# Patient Record
Sex: Female | Born: 1955 | Race: White | Hispanic: No | Smoking: Current every day smoker
Health system: Southern US, Community
[De-identification: ages and names within clinical notes are randomized; demographics above are authoritative.]

## PROBLEM LIST (undated history)

## (undated) DIAGNOSIS — R569 Unspecified convulsions: Secondary | ICD-10-CM

## (undated) DIAGNOSIS — F419 Anxiety disorder, unspecified: Secondary | ICD-10-CM

## (undated) HISTORY — PX: ABDOMINAL HYSTERECTOMY: SHX81

---

## 2004-07-25 ENCOUNTER — Emergency Department: Payer: Self-pay | Admitting: Unknown Physician Specialty

## 2004-08-11 ENCOUNTER — Emergency Department: Payer: Self-pay | Admitting: Emergency Medicine

## 2005-01-07 ENCOUNTER — Emergency Department: Payer: Self-pay | Admitting: Emergency Medicine

## 2005-01-08 ENCOUNTER — Ambulatory Visit: Payer: Self-pay | Admitting: Emergency Medicine

## 2005-07-29 ENCOUNTER — Emergency Department: Payer: Self-pay | Admitting: Internal Medicine

## 2008-02-05 ENCOUNTER — Emergency Department: Payer: Self-pay | Admitting: Emergency Medicine

## 2013-05-02 ENCOUNTER — Emergency Department: Payer: Self-pay | Admitting: Emergency Medicine

## 2013-07-22 ENCOUNTER — Emergency Department: Payer: Self-pay | Admitting: Emergency Medicine

## 2013-07-22 LAB — BASIC METABOLIC PANEL
Anion Gap: 11 (ref 7–16)
BUN: 19 mg/dL — AB (ref 7–18)
CALCIUM: 8.9 mg/dL (ref 8.5–10.1)
CREATININE: 1.11 mg/dL (ref 0.60–1.30)
Chloride: 107 mmol/L (ref 98–107)
Co2: 23 mmol/L (ref 21–32)
EGFR (African American): >60
EGFR (Non-African Amer.): 55 — ABNORMAL LOW
Glucose: 102 mg/dL — ABNORMAL HIGH (ref 65–99)
Osmolality: 284 (ref 275–301)
Potassium: 3.7 mmol/L (ref 3.5–5.1)
Sodium: 141 mmol/L (ref 136–145)

## 2013-07-22 LAB — CBC WITH DIFFERENTIAL/PLATELET
Basophil #: 0 10*3/uL (ref 0.0–0.1)
Basophil %: 0.1 %
EOS ABS: 0.4 10*3/uL (ref 0.0–0.7)
EOS PCT: 2.8 %
HCT: 39.8 % (ref 35.0–47.0)
HGB: 13.4 g/dL (ref 12.0–16.0)
Lymphocyte #: 5.5 10*3/uL — ABNORMAL HIGH (ref 1.0–3.6)
Lymphocyte %: 36.7 %
MCH: 30.1 pg (ref 26.0–34.0)
MCHC: 33.6 g/dL (ref 32.0–36.0)
MCV: 90 fL (ref 80–100)
Monocyte #: 0.9 x10 3/mm (ref 0.2–0.9)
Monocyte %: 6.2 %
NEUTROS ABS: 8.1 10*3/uL — AB (ref 1.4–6.5)
Neutrophil %: 54.2 %
Platelet: 213 10*3/uL (ref 150–440)
RBC: 4.44 10*6/uL (ref 3.80–5.20)
RDW: 14.4 % (ref 11.5–14.5)
WBC: 14.9 10*3/uL — AB (ref 3.6–11.0)

## 2013-07-22 LAB — URIC ACID: URIC ACID: 6.8 mg/dL — AB (ref 2.6–6.0)

## 2013-09-16 ENCOUNTER — Emergency Department: Payer: Self-pay | Admitting: Emergency Medicine

## 2013-09-16 LAB — URINALYSIS, COMPLETE
BILIRUBIN, UR: NEGATIVE
BLOOD: NEGATIVE
Bacteria: NONE SEEN
Glucose,UR: NEGATIVE mg/dL (ref 0–75)
KETONE: NEGATIVE
LEUKOCYTE ESTERASE: NEGATIVE
Nitrite: NEGATIVE
PH: 6 (ref 4.5–8.0)
Protein: NEGATIVE
SPECIFIC GRAVITY: 1.018 (ref 1.003–1.030)
Squamous Epithelial: <1
WBC UR: 5 /HPF (ref 0–5)

## 2013-09-16 LAB — CBC WITH DIFFERENTIAL/PLATELET
BASOS ABS: 0.2 10*3/uL — AB (ref 0.0–0.1)
Basophil %: 1.3 %
EOS ABS: 0.3 10*3/uL (ref 0.0–0.7)
Eosinophil %: 2.2 %
HCT: 39.7 % (ref 35.0–47.0)
HGB: 13.4 g/dL (ref 12.0–16.0)
LYMPHS PCT: 26.9 %
Lymphocyte #: 4.3 10*3/uL — ABNORMAL HIGH (ref 1.0–3.6)
MCH: 29.8 pg (ref 26.0–34.0)
MCHC: 33.9 g/dL (ref 32.0–36.0)
MCV: 88 fL (ref 80–100)
MONO ABS: 1 x10 3/mm — AB (ref 0.2–0.9)
Monocyte %: 6.3 %
NEUTROS PCT: 63.3 %
Neutrophil #: 10.1 10*3/uL — ABNORMAL HIGH (ref 1.4–6.5)
PLATELETS: 204 10*3/uL (ref 150–440)
RBC: 4.51 10*6/uL (ref 3.80–5.20)
RDW: 14.5 % (ref 11.5–14.5)
WBC: 15.9 10*3/uL — AB (ref 3.6–11.0)

## 2013-09-16 LAB — COMPREHENSIVE METABOLIC PANEL
ALK PHOS: 114 U/L
ALT: 26 U/L
Albumin: 3.3 g/dL — ABNORMAL LOW (ref 3.4–5.0)
Anion Gap: 9 (ref 7–16)
BUN: 18 mg/dL (ref 7–18)
CO2: 22 mmol/L (ref 21–32)
CREATININE: 1.08 mg/dL (ref 0.60–1.30)
Calcium, Total: 8.7 mg/dL (ref 8.5–10.1)
Chloride: 110 mmol/L — ABNORMAL HIGH (ref 98–107)
EGFR (African American): >60
EGFR (Non-African Amer.): 57 — ABNORMAL LOW
Glucose: 110 mg/dL — ABNORMAL HIGH (ref 65–99)
Osmolality: 284 (ref 275–301)
Potassium: 3.8 mmol/L (ref 3.5–5.1)
SGOT(AST): 22 U/L (ref 15–37)
Sodium: 141 mmol/L (ref 136–145)
Total Protein: 7.5 g/dL (ref 6.4–8.2)

## 2014-05-29 ENCOUNTER — Emergency Department
Admission: EM | Admit: 2014-05-29 | Discharge: 2014-05-30 | Disposition: A | Discharge status: Home / Self Care | Payer: Medicaid Other | Attending: Student | Admitting: Student

## 2014-05-29 ENCOUNTER — Encounter: Payer: Self-pay | Admitting: *Deleted

## 2014-05-29 DIAGNOSIS — R519 Headache, unspecified: Secondary | ICD-10-CM

## 2014-05-29 DIAGNOSIS — R51 Headache: Secondary | ICD-10-CM | POA: Diagnosis not present

## 2014-05-29 DIAGNOSIS — Z79899 Other long term (current) drug therapy: Secondary | ICD-10-CM | POA: Insufficient documentation

## 2014-05-29 HISTORY — DX: Anxiety disorder, unspecified: F41.9

## 2014-05-29 HISTORY — DX: Unspecified convulsions: R56.9

## 2014-05-29 NOTE — ED Notes (Signed)
PT ambulatory to triage with a headache since this morning.  Taking otc meds without relief.  Pt has nausea.  Alert.  Speech clear.

## 2014-05-29 NOTE — ED Provider Notes (Signed)
[facility] Emergency Department Provider Note  ____________________________________________  Time seen: Approximately 11:51 PM  I have reviewed the triage vital signs and the nursing notes.   HISTORY  Chief Complaint Headache    HPI Colleen HiddenRicky A Sanders is a 59 y.o. female with history of seizures, anxiety, headaches, presents for evaluation of gradual onset frontal throbbing headache that began this morning. Headache was not maximal at onset. She has felt nauseated but has had no vomiting. She has had this type of headache before and believes it may be stress-related. She has had no fevers, no neck pain or neck stiffness. Current severity 7 out of 10, pain has been constant. No modifying factors.   Past Medical History  Diagnosis Date  . Seizures   . Anxiety     There are no active problems to display for this patient.   History reviewed. No pertinent past surgical history.  Current Outpatient Rx  Name  Route  Sig  Dispense  Refill  . phenytoin (DILANTIN) 200 MG ER capsule   Oral   Take 200 mg by mouth 2 (two) times daily.           Allergies Review of patient's allergies indicates no known allergies.  No family history on file.  Social History History  Substance Use Topics  . Smoking status: Never Smoker   . Smokeless tobacco: Not on file  . Alcohol Use: No    Review of Systems Constitutional: No fever/chills Eyes: No visual changes. ENT: No sore throat. Cardiovascular: Denies chest pain. Respiratory: Denies shortness of breath. Gastrointestinal: No abdominal pain.  + nausea, no vomiting.  No diarrhea.  No constipation. Genitourinary: Negative for dysuria. Musculoskeletal: Negative for back pain. Skin: Negative for rash. Neurological: Positive for headaches, no focal weakness or numbness.  10-point ROS otherwise negative.  ____________________________________________   PHYSICAL EXAM:  VITAL SIGNS: ED Triage Vitals  Enc  Vitals Group     BP 05/29/14 2140 118/62 mmHg     Pulse Rate 05/29/14 2140 79     Resp 05/29/14 2140 20     Temp 05/29/14 2140 97.5 F (36.4 C)     Temp Source 05/29/14 2140 Oral     SpO2 05/29/14 2140 95 %     Weight 05/29/14 2140 200 lb (90.719 kg)     Height 05/29/14 2140 5\' 6"  (1.676 m)     Head Cir --      Peak Flow --      Pain Score 05/29/14 2142 9     Pain Loc --      Pain Edu? --      Excl. in GC? --     Constitutional: Alert and oriented. Well appearing and in no acute distress. Comfortably sitting at the edge of the bed Eyes: Conjunctivae are normal. PERRL. EOMI. Head: Atraumatic. Nose: No congestion/rhinnorhea. Mouth/Throat: Mucous membranes are moist.  Oropharynx non-erythematous. Neck: No stridor.  Supple without meningismus Cardiovascular: Normal rate, regular rhythm. Grossly normal heart sounds.  Good peripheral circulation. Respiratory: Normal respiratory effort.  No retractions. Lungs CTAB. Gastrointestinal: Soft and nontender. No distention. No abdominal bruits. No CVA tenderness. Genitourinary: Deferred Musculoskeletal: No lower extremity tenderness nor edema.  No joint effusions. Neurologic:  Normal speech and language. No gross focal neurologic deficits are appreciated. Speech is normal. No gait instability. 5 out of 5 strength in bilateral upper and lower extremities, sensation intact to light touch throughout, cranial nerves II through XII intact Skin:  Skin is warm, dry  and intact. No rash noted. Psychiatric: Mood and affect are normal. Speech and behavior are normal.  ____________________________________________   LABS (all labs ordered are listed, but only abnormal results are displayed)  Labs Reviewed - No data to display ____________________________________________  EKG none ____________________________________________  RADIOLOGY  none ____________________________________________   PROCEDURES  Procedure(s) performed: None  Critical  Care performed: No  ____________________________________________   INITIAL IMPRESSION / ASSESSMENT AND PLAN / ED COURSE  Pertinent labs & imaging results that were available during my care of the patient were reviewed by me and considered in my medical decision making (see chart for details).  Colleen HiddenRicky A Colleen Sanders is a 59 y.o. female with history of seizures, anxiety, headaches, presents for evaluation of gradual onset frontal throbbing headache that began this morning. On exam she is very well-appearing and in no acute distress. Supple neck, afebrile, intact neurological exam. Suspect tension versus migraine headache. We'll treat with migraine cocktail and anticipate discharge home. Not consistent with subarachnoid hemorrhage or meningitis.  ----------------------------------------- 2:02 AM on 05/30/2014 -----------------------------------------  The patient is resting comfortably. She continues to appear well. She reports headache is completely resolved at this time (currently 0 out of 10 on the pain scale) with Reglan, Toradol, Benadryl. DC home with return precautions. ____________________________________________   FINAL CLINICAL IMPRESSION(S) / ED DIAGNOSES  Final diagnoses:  Frontal headache      Colleen DossEryka A Mckala Pantaleon, MD 05/30/14 0202

## 2014-05-30 ENCOUNTER — Encounter: Payer: Self-pay | Admitting: Emergency Medicine

## 2014-05-30 MED ORDER — DIPHENHYDRAMINE HCL 50 MG/ML IJ SOLN
INTRAMUSCULAR | Status: AC
  Administered 2014-05-30: 12.5 mg via INTRAVENOUS
  Filled 2014-05-30: qty 1

## 2014-05-30 MED ORDER — KETOROLAC TROMETHAMINE 30 MG/ML IJ SOLN
INTRAMUSCULAR | Status: AC
  Administered 2014-05-30: 30 mg via INTRAVENOUS
  Filled 2014-05-30: qty 1

## 2014-05-30 MED ORDER — METOCLOPRAMIDE HCL 5 MG/ML IJ SOLN
INTRAMUSCULAR | Status: AC
  Administered 2014-05-30: 10 mg via INTRAVENOUS
  Filled 2014-05-30: qty 2

## 2014-05-30 MED ORDER — DIPHENHYDRAMINE HCL 50 MG/ML IJ SOLN
12.5000 mg | Freq: Once | INTRAMUSCULAR | Status: AC
  Administered 2014-05-30: 12.5 mg via INTRAVENOUS

## 2014-05-30 MED ORDER — METOCLOPRAMIDE HCL 5 MG/ML IJ SOLN
10.0000 mg | Freq: Once | INTRAMUSCULAR | Status: AC
  Administered 2014-05-30: 10 mg via INTRAVENOUS

## 2014-05-30 MED ORDER — SODIUM CHLORIDE 0.9 % IV BOLUS (SEPSIS)
500.0000 mL | Freq: Once | INTRAVENOUS | Status: AC
  Administered 2014-05-30: 500 mL via INTRAVENOUS

## 2014-05-30 MED ORDER — KETOROLAC TROMETHAMINE 30 MG/ML IJ SOLN
30.0000 mg | Freq: Once | INTRAMUSCULAR | Status: AC
  Administered 2014-05-30: 30 mg via INTRAVENOUS

## 2014-05-30 NOTE — ED Notes (Signed)
Patient reports she has had a headache since this morning and that she has tried taking the usual over the counter meds but they have not worked.  Pt states headache is at her forehead and feels the same as previous headaches, only difference is that is will not go away.

## 2014-07-13 ENCOUNTER — Emergency Department
Admission: EM | Admit: 2014-07-13 | Discharge: 2014-07-13 | Disposition: A | Discharge status: Home / Self Care | Payer: Medicaid Other | Attending: Emergency Medicine | Admitting: Emergency Medicine

## 2014-07-13 ENCOUNTER — Encounter: Payer: Self-pay | Admitting: Emergency Medicine

## 2014-07-13 DIAGNOSIS — B86 Scabies: Secondary | ICD-10-CM | POA: Diagnosis not present

## 2014-07-13 DIAGNOSIS — Z79899 Other long term (current) drug therapy: Secondary | ICD-10-CM | POA: Diagnosis not present

## 2014-07-13 DIAGNOSIS — Z720 Tobacco use: Secondary | ICD-10-CM | POA: Diagnosis not present

## 2014-07-13 DIAGNOSIS — R21 Rash and other nonspecific skin eruption: Secondary | ICD-10-CM | POA: Diagnosis present

## 2014-07-13 MED ORDER — PREDNISONE 20 MG PO TABS: 20.0000 mg | ORAL_TABLET | Freq: Every day | ORAL | Status: AC

## 2014-07-13 MED ORDER — PERMETHRIN 5 % EX CREA: 1.0000 "application " | TOPICAL_CREAM | Freq: Once | CUTANEOUS | Status: AC

## 2014-07-13 MED ORDER — HYDROXYZINE PAMOATE 25 MG PO CAPS: 25.0000 mg | ORAL_CAPSULE | Freq: Three times a day (TID) | ORAL | Status: AC | PRN

## 2014-07-13 NOTE — ED Notes (Signed)
Patient stable at discharge,

## 2014-07-13 NOTE — Discharge Instructions (Signed)

## 2014-07-13 NOTE — ED Provider Notes (Signed)
[facility] Emergency Department Provider Note  ____________________________________________  Time seen: 1144  I have reviewed the triage vital signs and the nursing notes.   HISTORY  Chief Complaint Rash    HPI Colleen Sanders is a 59 y.o. female arrives here today with a rash that is on her arms and legs thighs belly says it's red itchy bunch of small spots that has been coming and going off and on for a few weeks and is here today for further evaluation and treatment no other complaints this time nothing seems to be making her particularly better or worse   Past Medical History  Diagnosis Date  . Seizures   . Anxiety     There are no active problems to display for this patient.   History reviewed. No pertinent past surgical history.  Current Outpatient Rx  Name  Route  Sig  Dispense  Refill  . hydrOXYzine (VISTARIL) 25 MG capsule   Oral   Take 1 capsule (25 mg total) by mouth 3 (three) times daily as needed for itching.   30 capsule   0   . permethrin (ACTICIN) 5 % cream   Topical   Apply 1 application topically once. Thoroughly massage cream (30 g for average adult) from head to soles of feet; leave on for 8 to 14 hours before removing (shower or bath)also apply on the hairline, neck, scalp, temple, and forehead; may repeat if living mites are observed 14 days after first treatment   60 g   1   . phenytoin (DILANTIN) 200 MG ER capsule   Oral   Take 200 mg by mouth 2 (two) times daily.         . predniSONE (DELTASONE) 20 MG tablet   Oral   Take 1 tablet (20 mg total) by mouth daily.   5 tablet   0     Allergies Review of patient's allergies indicates no known allergies.  No family history on file.  Social History History  Substance Use Topics  . Smoking status: Current Every Day Smoker    Types: Cigarettes  . Smokeless tobacco: Not on file  . Alcohol Use: No    Review of Systems Constitutional: No fever/chills Eyes:  No visual changes. ENT: No sore throat. Cardiovascular: Denies chest pain. Respiratory: Denies shortness of breath. Gastrointestinal: No abdominal pain.  No nausea, no vomiting.  No diarrhea.  No constipation. Genitourinary: Negative for dysuria. Musculoskeletal: Negative for back pain. Skin: Rash upper and lower extremities and trunk Neurological: Negative for headaches, focal weakness or numbness.  10-point ROS otherwise negative.  ____________________________________________   PHYSICAL EXAM:  VITAL SIGNS: ED Triage Vitals  Enc Vitals Group     BP 07/13/14 1132 128/77 mmHg     Pulse Rate 07/13/14 1132 90     Resp 07/13/14 1132 18     Temp 07/13/14 1132 97.5 F (36.4 C)     Temp Source 07/13/14 1132 Oral     SpO2 07/13/14 1132 95 %     Weight 07/13/14 1132 200 lb (90.719 kg)     Height 07/13/14 1132  (1.676 m)     Head Cir --      Peak Flow --      Pain Score 07/13/14 1133 2     Pain Loc --      Pain Edu? --      Excl. in GC? --     Constitutional: Alert and oriented. Well appearing and in  no acute distress. Eyes: Conjunctivae are normal. PERRL. EOMI. Head: Atraumatic. Nose: No congestion/rhinnorhea. Mouth/Throat: Mucous membranes are moist.  Oropharynx non-erythematous. Neck: No stridor.   Cardiovascular: Normal rate, regular rhythm. Grossly normal heart sounds.  Good peripheral circulation. Respiratory: Normal respiratory effort.  No retractions. Lungs CTAB. Gastrointestinal: Soft and nontender. No distention. No abdominal bruits. No CVA tenderness. Musculoskeletal: No lower extremity tenderness nor edema.  No joint effusions. Neurologic:  Normal speech and language. No gross focal neurologic deficits are appreciated. Speech is normal. No gait instability. Skin:  Skin is warm, dry and intact. Fine rash on the upper arms and the upper thighs on her hands and her trunk across her belly seemed of burrow-like lesions with areas of excoriation and erythema around  them Psychiatric: Mood and affect are normal. Speech and behavior are normal.  ____________________________________________     PROCEDURES  Procedure(s) performed: None  Critical Care performed: No  ____________________________________________   INITIAL IMPRESSION / ASSESSMENT AND PLAN / ED COURSE  Pertinent labs & imaging results that were available during my care of the patient were reviewed by me and considered in my medical decision making (see chart for details).  Initial impression on this patient as scabies chest rash that has spread up and down her arms across her trunk that seems to come and go in cycles every couple of weeks itching worse at night and started her on permethrin hydroxyzine and prednisone and have her follow-up with dermatology if it persists ____________________________________________   FINAL CLINICAL IMPRESSION(S) / ED DIAGNOSES  Final diagnoses:  Scabies      Quill Grinder Rosalyn GessWilliam C Zailyn Rowser, PA-C 07/13/14 1202  Phineas SemenGraydon Goodman, MD 07/13/14 1353

## 2014-07-13 NOTE — ED Notes (Signed)
C/o red itchy burning rash to bilateral upper and lower ext

## 2014-10-25 ENCOUNTER — Emergency Department
Admission: EM | Admit: 2014-10-25 | Discharge: 2014-10-25 | Disposition: A | Discharge status: Home / Self Care | Payer: Medicaid Other | Attending: Emergency Medicine | Admitting: Emergency Medicine

## 2014-10-25 ENCOUNTER — Encounter: Payer: Self-pay | Admitting: *Deleted

## 2014-10-25 DIAGNOSIS — Z79899 Other long term (current) drug therapy: Secondary | ICD-10-CM | POA: Insufficient documentation

## 2014-10-25 DIAGNOSIS — F419 Anxiety disorder, unspecified: Secondary | ICD-10-CM | POA: Insufficient documentation

## 2014-10-25 DIAGNOSIS — M65052 Abscess of tendon sheath, left thigh: Secondary | ICD-10-CM

## 2014-10-25 DIAGNOSIS — Z720 Tobacco use: Secondary | ICD-10-CM | POA: Insufficient documentation

## 2014-10-25 DIAGNOSIS — Z7952 Long term (current) use of systemic steroids: Secondary | ICD-10-CM | POA: Diagnosis not present

## 2014-10-25 MED ORDER — SULFAMETHOXAZOLE-TRIMETHOPRIM 800-160 MG PO TABS: 1.0000 | ORAL_TABLET | Freq: Two times a day (BID) | ORAL | Status: AC

## 2014-10-25 NOTE — ED Notes (Signed)
Patient c/o return of left upper thigh pain. Patient states she had MRSA there in the past.

## 2014-10-25 NOTE — ED Provider Notes (Signed)
[facility] Emergency Department Provider Note  ____________________________________________  Time seen: Approximately 1:57 PM  I have reviewed the triage vital signs and the nursing notes.   HISTORY  Chief Complaint Leg Pain    HPI Colleen Sanders is a 59 y.o. female patient complain of a papular lesion medial left thigh area for 4 days. Patient states she is concerned because a past history of MRSA. Patient denies any drainage from his area and denies any fever associated this complaint. Patient rated her discomfort as a 9/10.  Refused pain medication.   Past Medical History  Diagnosis Date  . Seizures (HCC)   . Anxiety     There are no active problems to display for this patient.   Past Surgical History  Procedure Laterality Date  . Abdominal hysterectomy      Current Outpatient Rx  Name  Route  Sig  Dispense  Refill  . hydrOXYzine (VISTARIL) 25 MG capsule   Oral   Take 1 capsule (25 mg total) by mouth 3 (three) times daily as needed for itching.   30 capsule   0   . permethrin (ACTICIN) 5 % cream   Topical   Apply 1 application topically once. Thoroughly massage cream (30 g for average adult) from head to soles of feet; leave on for 8 to 14 hours before removing (shower or bath)also apply on the hairline, neck, scalp, temple, and forehead; may repeat if living mites are observed 14 days after first treatment   60 g   1   . phenytoin (DILANTIN) 200 MG ER capsule   Oral   Take 200 mg by mouth 2 (two) times daily.         . predniSONE (DELTASONE) 20 MG tablet   Oral   Take 1 tablet (20 mg total) by mouth daily.   5 tablet   0     Allergies Review of patient's allergies indicates no known allergies.  No family history on file.  Social History Social History  Substance Use Topics  . Smoking status: Current Every Day Smoker    Types: Cigarettes  . Smokeless tobacco: None  . Alcohol Use: No    Review of  Systems Constitutional: No fever/chills Eyes: No visual changes. ENT: No sore throat. Cardiovascular: Denies chest pain. Respiratory: Denies shortness of breath. Gastrointestinal: No abdominal pain.  No nausea, no vomiting.  No diarrhea.  No constipation. Genitourinary: Negative for dysuria. Musculoskeletal: Negative for back pain. Skin: Negative for rash. Neurological: Negative for headaches, focal weakness or numbness. Psychiatric:Anxiety 10-point ROS otherwise negative.  ____________________________________________   PHYSICAL EXAM:  VITAL SIGNS: ED Triage Vitals  Enc Vitals Group     BP 10/25/14 1339 99/68 mmHg     Pulse Rate 10/25/14 1339 93     Resp 10/25/14 1339 18     Temp 10/25/14 1339 97.7 F (36.5 C)     Temp Source 10/25/14 1339 Oral     SpO2 10/25/14 1339 99 %     Weight 10/25/14 1339 213 lb (96.616 kg)     Height 10/25/14 1339  (1.651 m)     Head Cir --      Peak Flow --      Pain Score 10/25/14 1340 9     Pain Loc --      Pain Edu? --      Excl. in GC? --     Constitutional: Alert and oriented. Well appearing and in no acute distress. Eyes:  Conjunctivae are normal. PERRL. EOMI. Head: Atraumatic. Nose: No congestion/rhinnorhea. Mouth/Throat: Mucous membranes are moist.  Oropharynx non-erythematous. Neck: No stridor.  No cervical spine tenderness to palpation. Hematological/Lymphatic/Immunilogical: No cervical lymphadenopathy. Cardiovascular: Normal rate, regular rhythm. Grossly normal heart sounds.  Good peripheral circulation. Respiratory: Normal respiratory effort.  No retractions. Lungs CTAB. Gastrointestinal: Soft and nontender. No distention. No abdominal bruits. No CVA tenderness. Musculoskeletal: No lower extremity tenderness nor edema.  No joint effusions. Neurologic:  Normal speech and language. No gross focal neurologic deficits are appreciated. No gait instability. Skin:  Skin is warm, dry and intact. No rash noted. Papular lesion on  erythematous base. Area is nonfluctuant without discharge. Psychiatric: Mood and affect are normal. Speech and behavior are normal.  ____________________________________________   LABS (all labs ordered are listed, but only abnormal results are displayed)  Labs Reviewed - No data to display ____________________________________________  EKG   ____________________________________________  RADIOLOGY   ____________________________________________   PROCEDURES  Procedure(s) performed: None  Critical Care performed: No  ____________________________________________   INITIAL IMPRESSION / ASSESSMENT AND PLAN / ED COURSE  Pertinent labs & imaging results that were available during my care of the patient were reviewed by me and considered in my medical decision making (see chart for details).  Bacterial skin infection medial inner upper thigh. Discussed rationale for not performing I&D at this time. Patient advised to apply warm compresses 2-3 times a day for 5 minutes. She advised to keep the area bandaged to reduce irritation. Patient given a prescription for Bactrim DS to take as directed. Advised to read ER if condition worsens. ____________________________________________   FINAL CLINICAL IMPRESSION(S) / ED DIAGNOSES  Final diagnoses:  Abscess of tendon of left thigh      RonaldJoni ReiningA-C 10/25/14 1405  Jene Every, MD 10/25/14 [phone]

## 2014-10-25 NOTE — Discharge Instructions (Signed)
Apply warm compresses to area 3 times a day for 5 minutes.  Do not try to rupture area.  Keep bandage until healed.

## 2016-01-10 IMAGING — CT CT STONE STUDY
2 of 4 series · 16 of 46 positions shown, 18 images · non-contrast
Comparison: None.

CLINICAL DATA: Right-sided abdominal pain

EXAM:
CT ABDOMEN AND PELVIS WITHOUT CONTRAST
TECHNIQUE: Multidetector CT imaging of the abdomen and pelvis was performed
following the standard protocol without IV contrast.

[Series 2: stone standard full · axial · 0.84mm/px · z∈[-494,-54]mm · 13 of 98 slices shown, 15 images]
[im 5/98  soft-tissue]
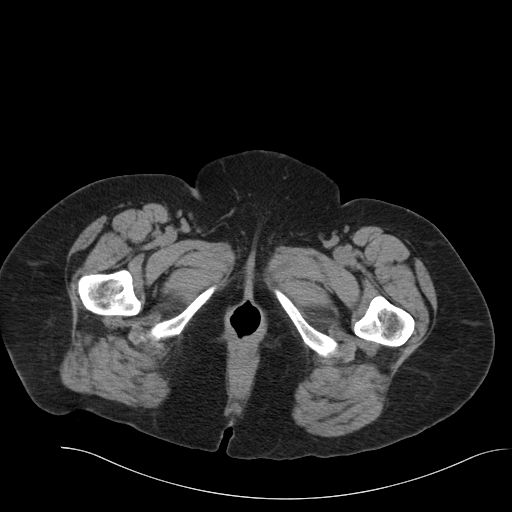
[im 5/98  bone]
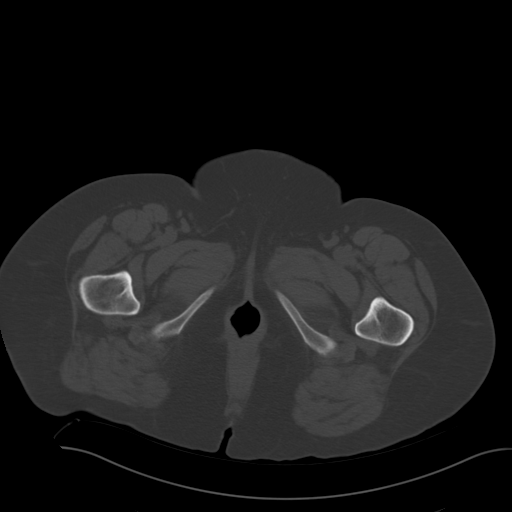
[im 13/98  soft-tissue]
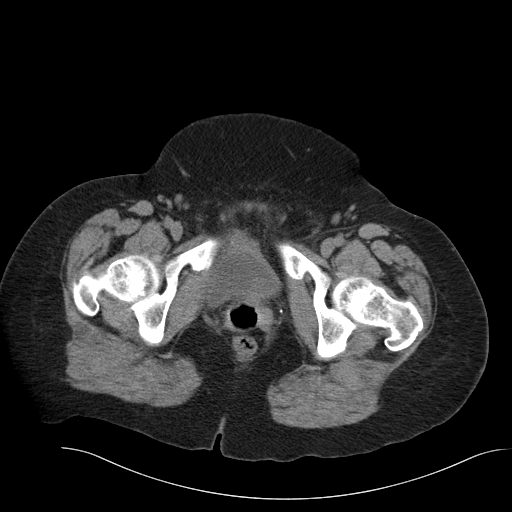
[im 21/98  soft-tissue]
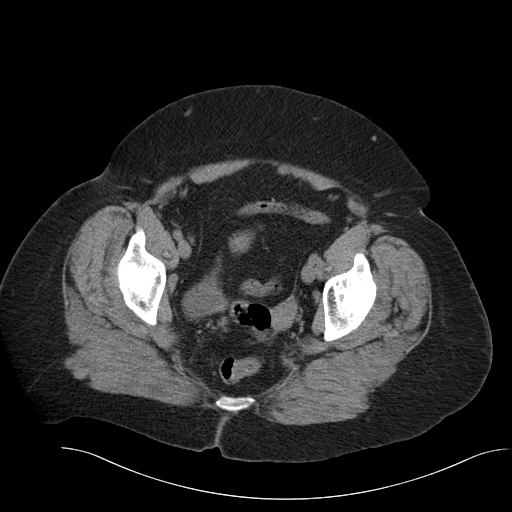
[im 29/98  soft-tissue]
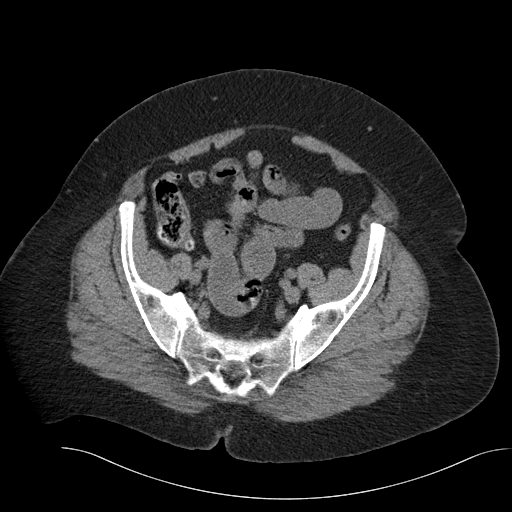
[im 33/98  soft-tissue]
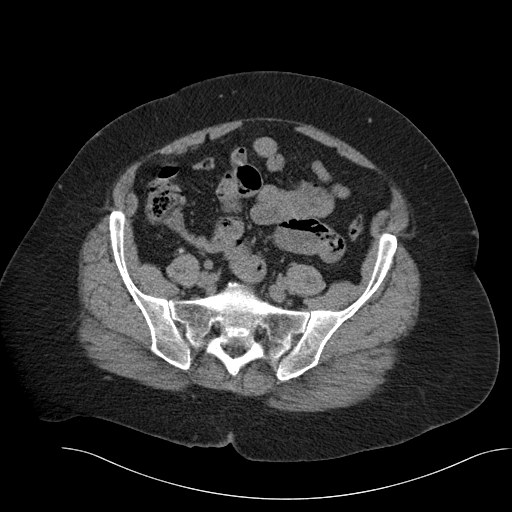
[im 41/98  soft-tissue]
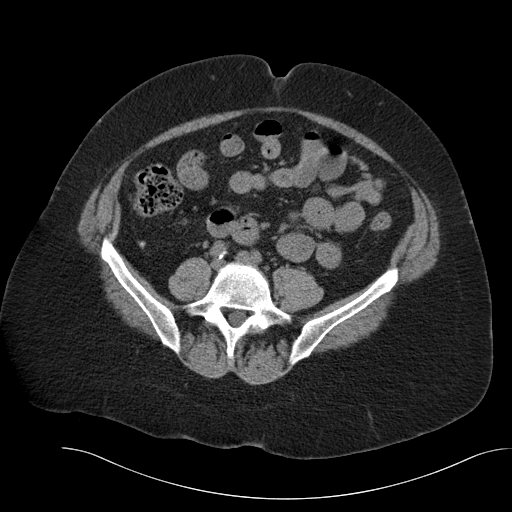
[im 49/98  soft-tissue]
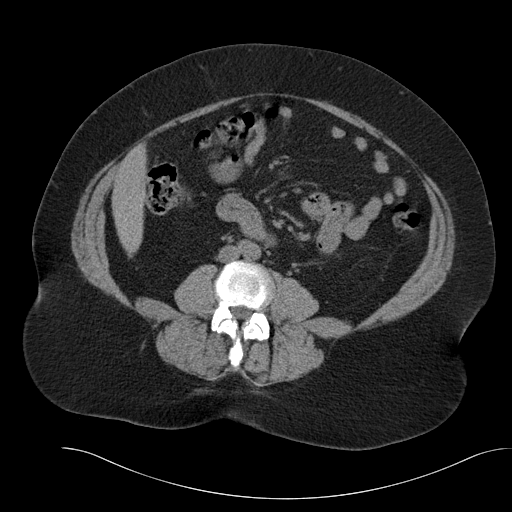
[im 57/98  soft-tissue]
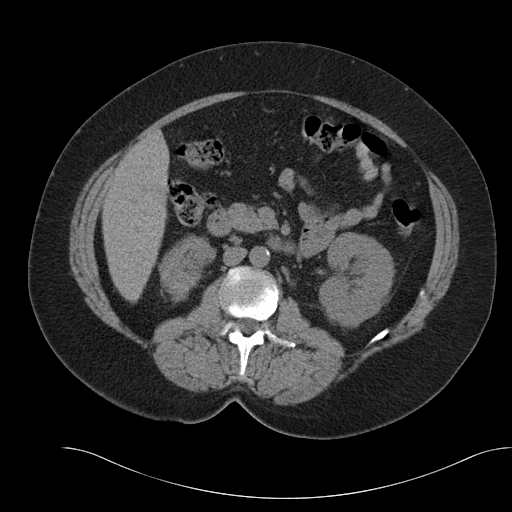
[im 65/98  soft-tissue]
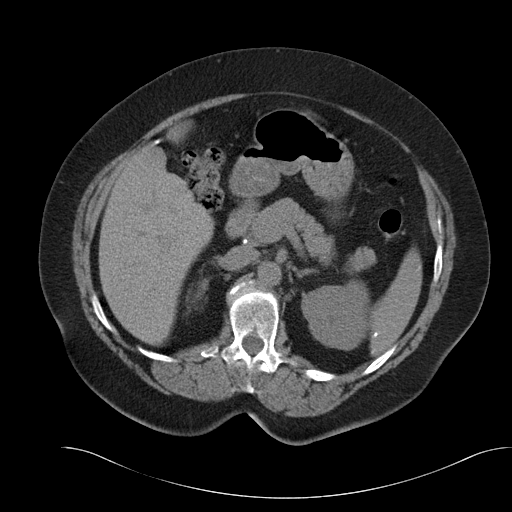
[im 65/98  bone]
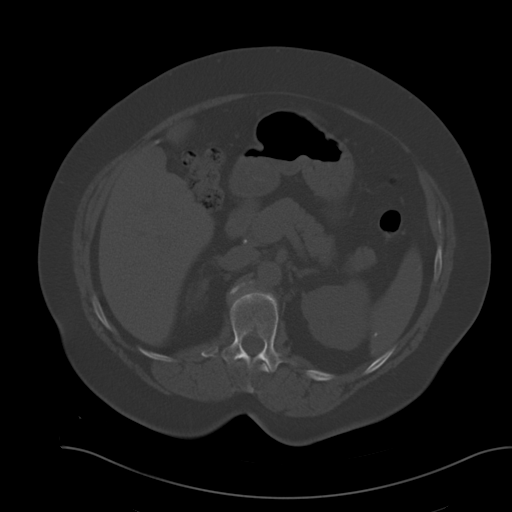
[im 69/98  soft-tissue]
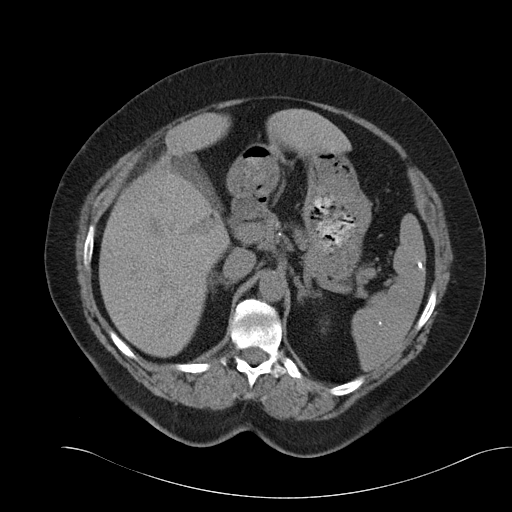
[im 77/98  soft-tissue]
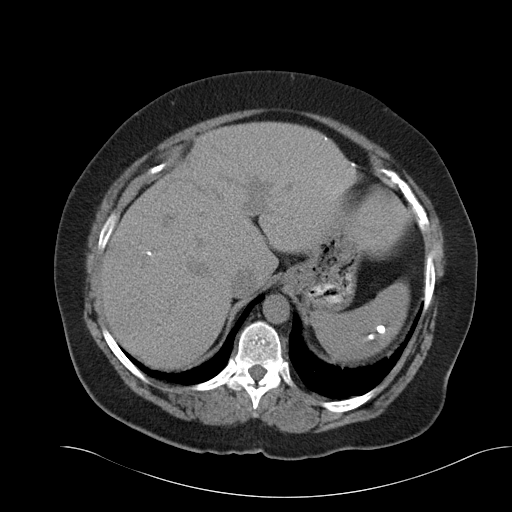
[im 85/98  soft-tissue]
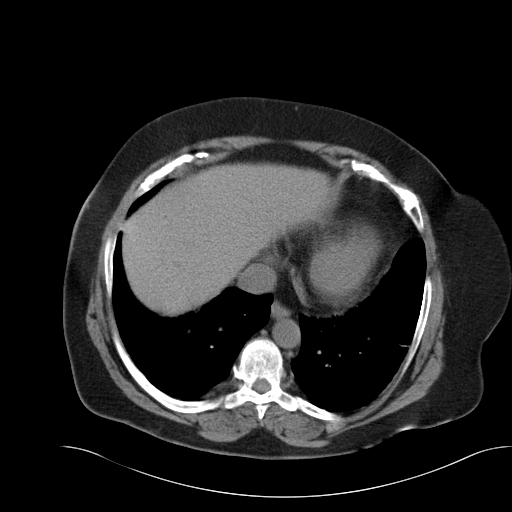
[im 93/98  soft-tissue]
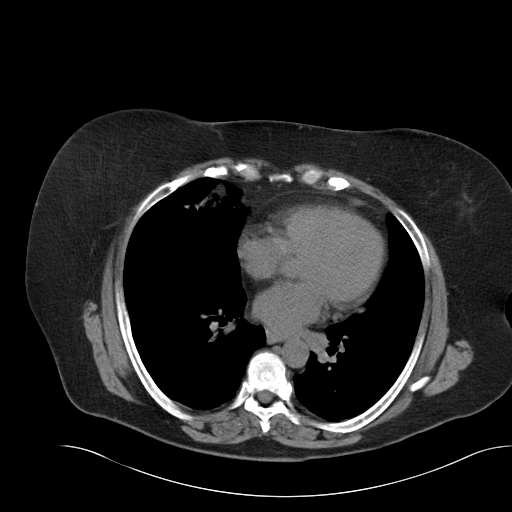

[Series 5: cor stone standard full · coronal · 0.86mm/px · 3 of 176 slices shown]
[im 59/176  soft-tissue]
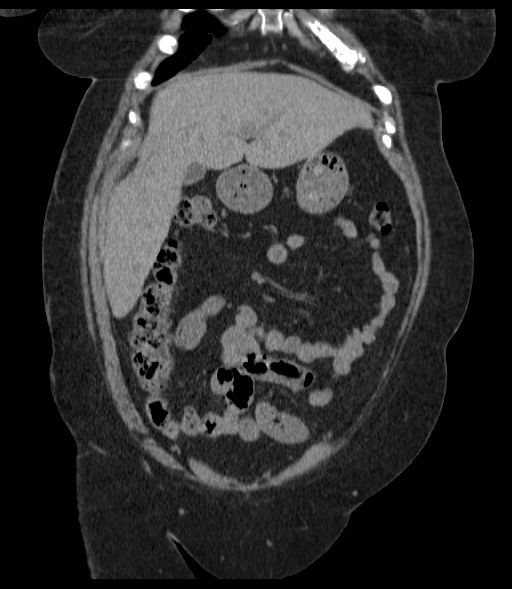
[im 78/176  soft-tissue]
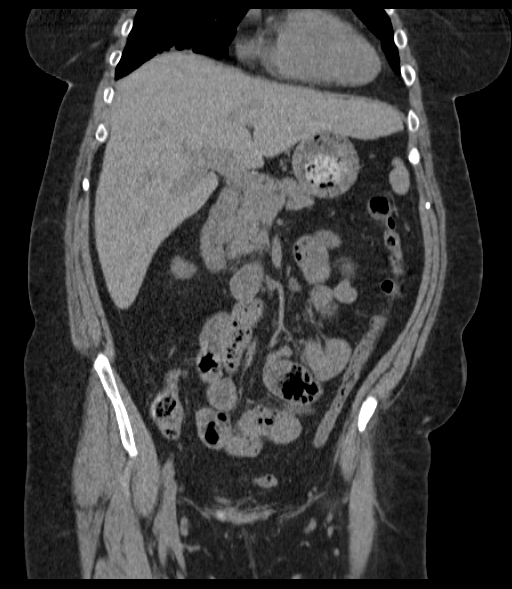
[im 98/176  soft-tissue]
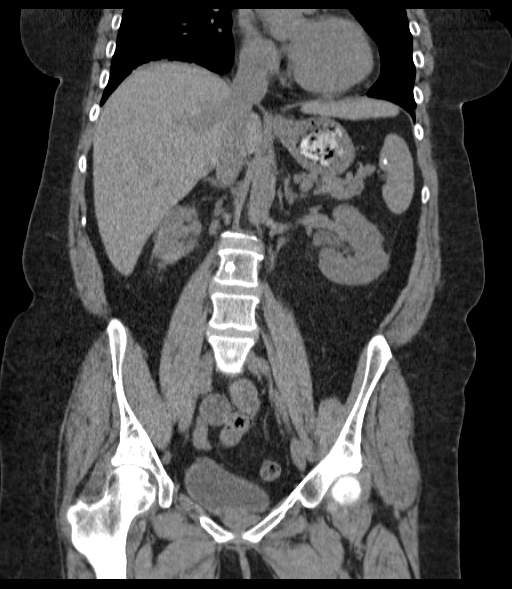

[16 of 46 positions shown; findings below may reference images not displayed]

FINDINGS: Dependent atelectasis at the left base. There patchy airspace
opacities at the base of the right middle lobe. Ground-glass
opacities in the posterior right lower lobe.

The anterior margin of the anterior segment of the right lobe of the
liver a scalloped. The adjacent fat contains stranding. There are
calcifications within the adjacent liver capsule.

Several calcified granulomata within the liver and spleen are
present.

Severe chronic atrophic changes of the right kidney. 5 mm
hyperdensity at the lower pole of the right kidney.

There is compensatory hypertrophy of the left kidney which is
otherwise unremarkable.

Pancreas and adrenal glands are within normal limits. Gallbladder is
unremarkable.

Normal appendix. Unremarkable bladder. Adnexa are within normal
limits. Uterus is absent.

Nonspecific sclerotic focus in L3. No vertebral compression
deformity.
IMPRESSION: There is an abnormal appearance involving the anterior liver. There
is also stranding in the adjacent fat. An inflammatory process
involving fat is not excluded. The findings in the adjacent liver
are likely related to chronic disease.

Severe atrophy of the right kidney.

5 mm hyperdensity in the lower pole of the right kidney. If the
patient is at high risk for malignancy, MRI can be performed to
further characterize.

Abnormal appearance at the base of the right middle and lower lobes
as described. An inflammatory process is not excluded.
